# Patient Record
Sex: Female | Born: 1940 | Race: White | Hispanic: No | Marital: Single | Smoking: Never smoker
Health system: Southern US, Community
[De-identification: ages and names within clinical notes are randomized; demographics above are authoritative.]

## PROBLEM LIST (undated history)

## (undated) DIAGNOSIS — C801 Malignant (primary) neoplasm, unspecified: Secondary | ICD-10-CM

## (undated) HISTORY — PX: COLON SURGERY: SHX602

---

## 1999-07-31 ENCOUNTER — Encounter: Admission: RE | Admit: 1999-07-31 | Discharge: 1999-07-31 | Payer: Self-pay | Admitting: Gynecology

## 1999-07-31 ENCOUNTER — Encounter: Payer: Self-pay | Admitting: Gynecology

## 2000-09-13 ENCOUNTER — Encounter: Payer: Self-pay | Admitting: Gynecology

## 2000-09-13 ENCOUNTER — Encounter: Admission: RE | Admit: 2000-09-13 | Discharge: 2000-09-13 | Payer: Self-pay | Admitting: Gynecology

## 2000-09-13 ENCOUNTER — Other Ambulatory Visit: Admission: RE | Admit: 2000-09-13 | Discharge: 2000-09-13 | Payer: Self-pay | Admitting: Gynecology

## 2001-10-17 ENCOUNTER — Encounter: Admission: RE | Admit: 2001-10-17 | Discharge: 2001-10-17 | Payer: Self-pay | Admitting: Gynecology

## 2001-10-17 ENCOUNTER — Other Ambulatory Visit: Admission: RE | Admit: 2001-10-17 | Discharge: 2001-10-17 | Payer: Self-pay | Admitting: Gynecology

## 2001-10-17 ENCOUNTER — Encounter: Payer: Self-pay | Admitting: Gynecology

## 2002-10-23 ENCOUNTER — Encounter: Admission: RE | Admit: 2002-10-23 | Discharge: 2002-10-23 | Payer: Self-pay | Admitting: Gynecology

## 2002-10-23 ENCOUNTER — Other Ambulatory Visit: Admission: RE | Admit: 2002-10-23 | Discharge: 2002-10-23 | Payer: Self-pay | Admitting: Gynecology

## 2002-10-23 ENCOUNTER — Encounter: Payer: Self-pay | Admitting: Gynecology

## 2003-11-12 ENCOUNTER — Other Ambulatory Visit: Admission: RE | Admit: 2003-11-12 | Discharge: 2003-11-12 | Payer: Self-pay | Admitting: Gynecology

## 2004-04-07 ENCOUNTER — Encounter: Admission: RE | Admit: 2004-04-07 | Discharge: 2004-04-07 | Payer: Self-pay | Admitting: Gynecology

## 2005-04-13 ENCOUNTER — Encounter: Admission: RE | Admit: 2005-04-13 | Discharge: 2005-04-13 | Payer: Self-pay | Admitting: Gynecology

## 2005-04-13 ENCOUNTER — Other Ambulatory Visit: Admission: RE | Admit: 2005-04-13 | Discharge: 2005-04-13 | Payer: Self-pay | Admitting: Gynecology

## 2006-05-10 ENCOUNTER — Other Ambulatory Visit: Admission: RE | Admit: 2006-05-10 | Discharge: 2006-05-10 | Payer: Self-pay | Admitting: Gynecology

## 2006-05-10 ENCOUNTER — Encounter: Admission: RE | Admit: 2006-05-10 | Discharge: 2006-05-10 | Payer: Self-pay | Admitting: Gynecology

## 2007-07-04 ENCOUNTER — Encounter: Admission: RE | Admit: 2007-07-04 | Discharge: 2007-07-04 | Payer: Self-pay | Admitting: Gynecology

## 2007-07-11 ENCOUNTER — Encounter: Admission: RE | Admit: 2007-07-11 | Discharge: 2007-07-11 | Payer: Self-pay | Admitting: Gynecology

## 2007-12-19 ENCOUNTER — Encounter: Admission: RE | Admit: 2007-12-19 | Discharge: 2007-12-19 | Payer: Self-pay | Admitting: Gynecology

## 2008-07-05 ENCOUNTER — Encounter: Admission: RE | Admit: 2008-07-05 | Discharge: 2008-07-05 | Payer: Self-pay | Admitting: Gynecology

## 2009-07-06 ENCOUNTER — Encounter: Admission: RE | Admit: 2009-07-06 | Discharge: 2009-07-06 | Payer: Self-pay | Admitting: Gynecology

## 2010-02-04 ENCOUNTER — Encounter: Payer: Self-pay | Admitting: Gynecology

## 2010-06-19 IMAGING — MG MM DIAGNOSTIC UNILATERAL L
4 series · 4 of 4 positions shown · non-contrast
Comparison: 07/11/2007

CLINICAL DATA: Follow-up left breast calcifications.

DIGITAL DIAGNOSTIC LEFT MAMMOGRAM WITH CAD

[L CC (1 of 2)]
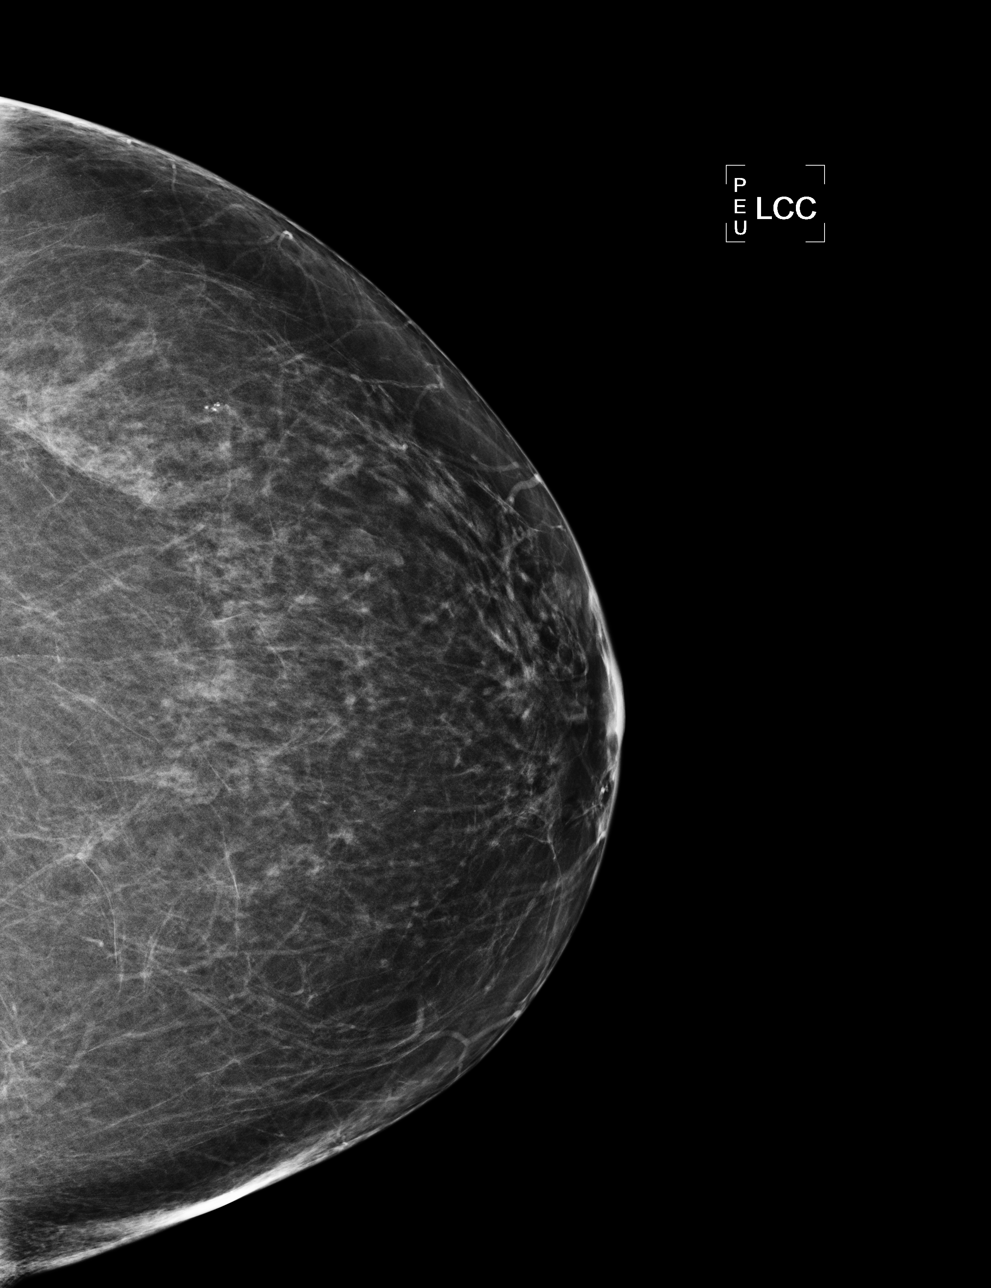

[L MLO]
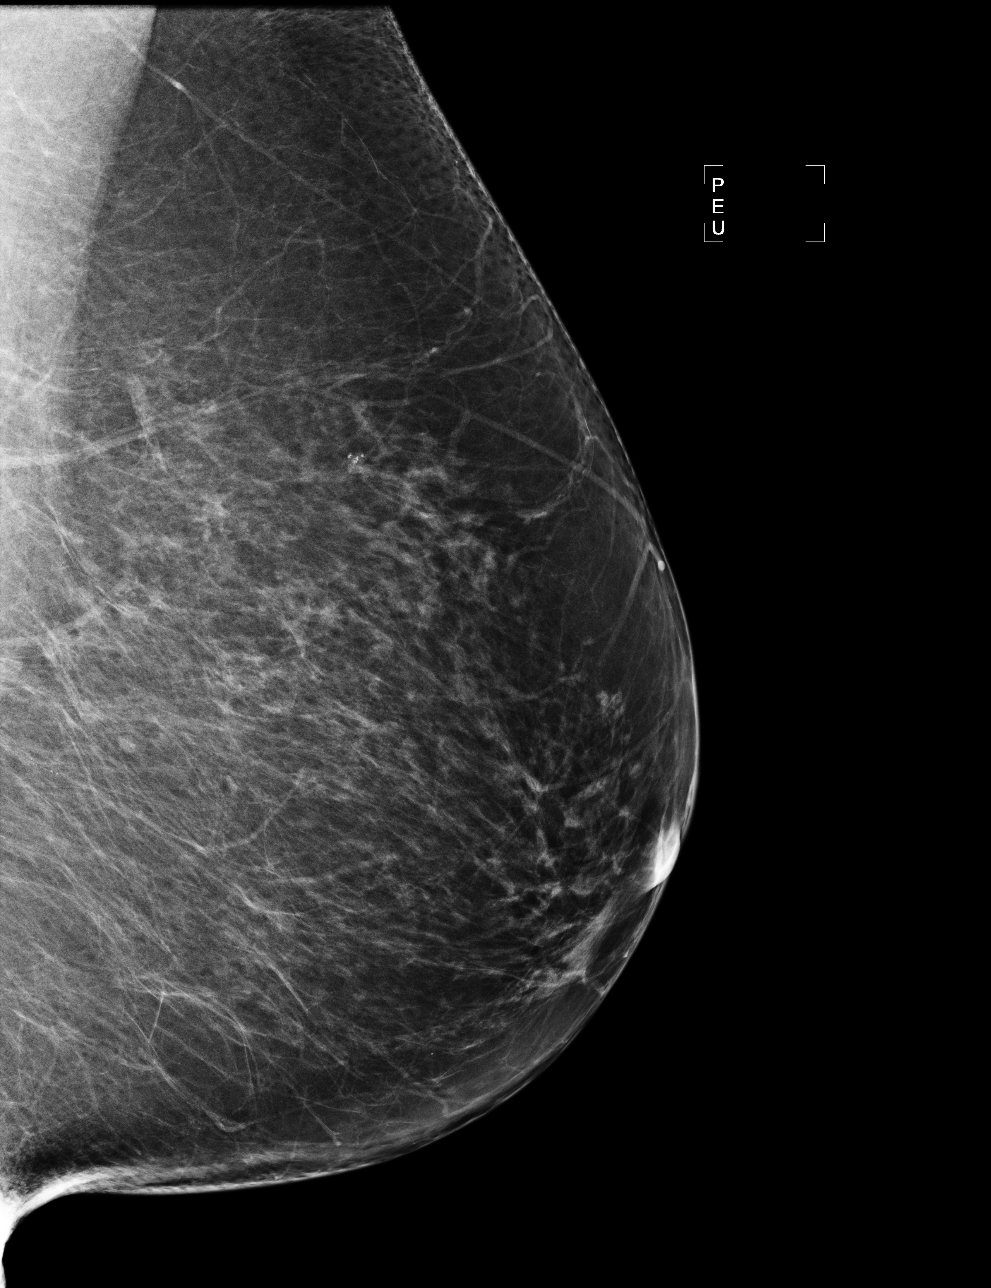

[L CC (2 of 2)]
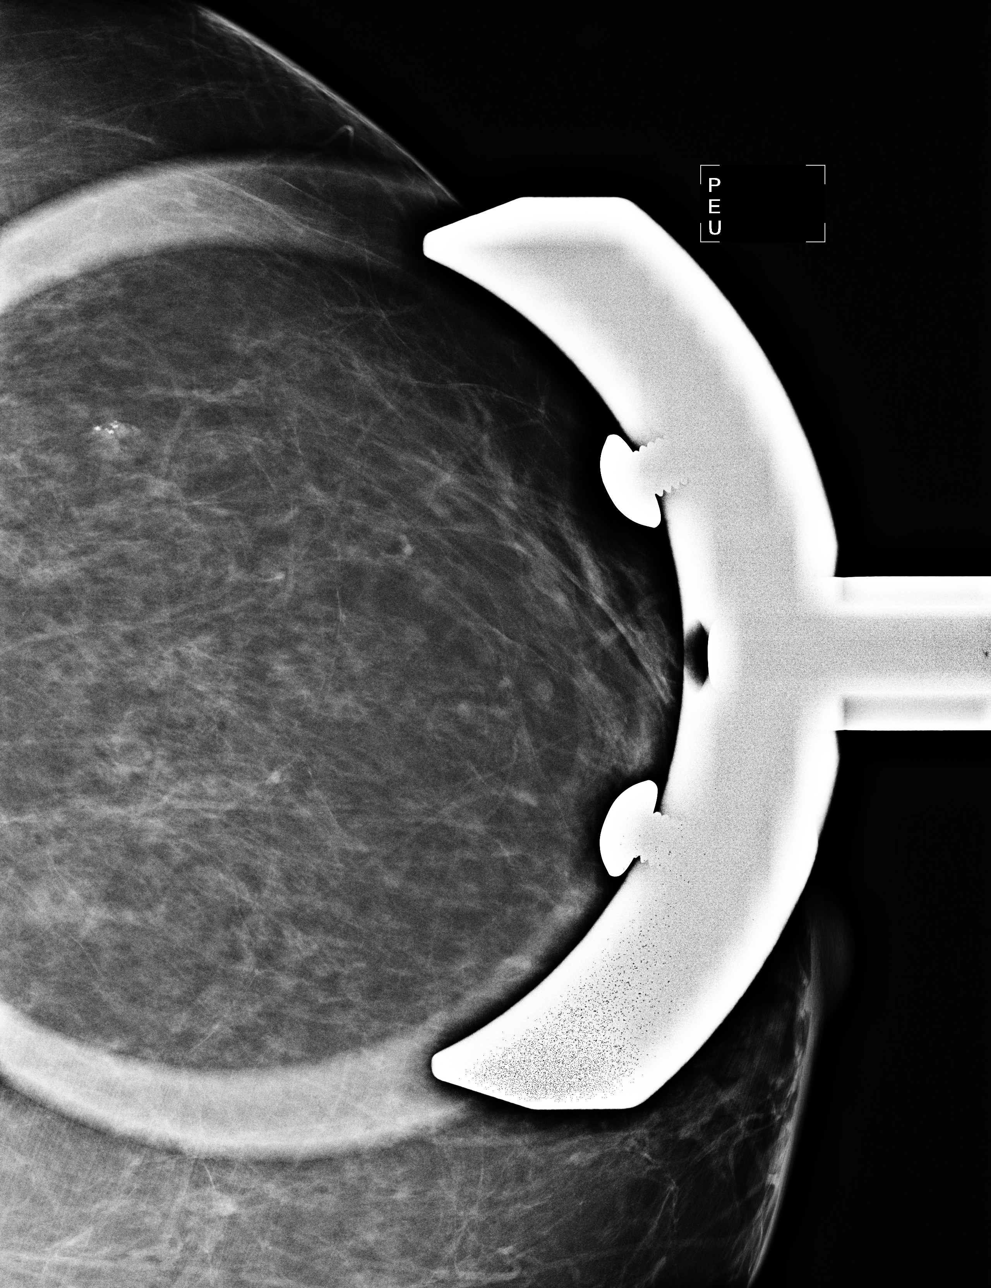

[L ML]
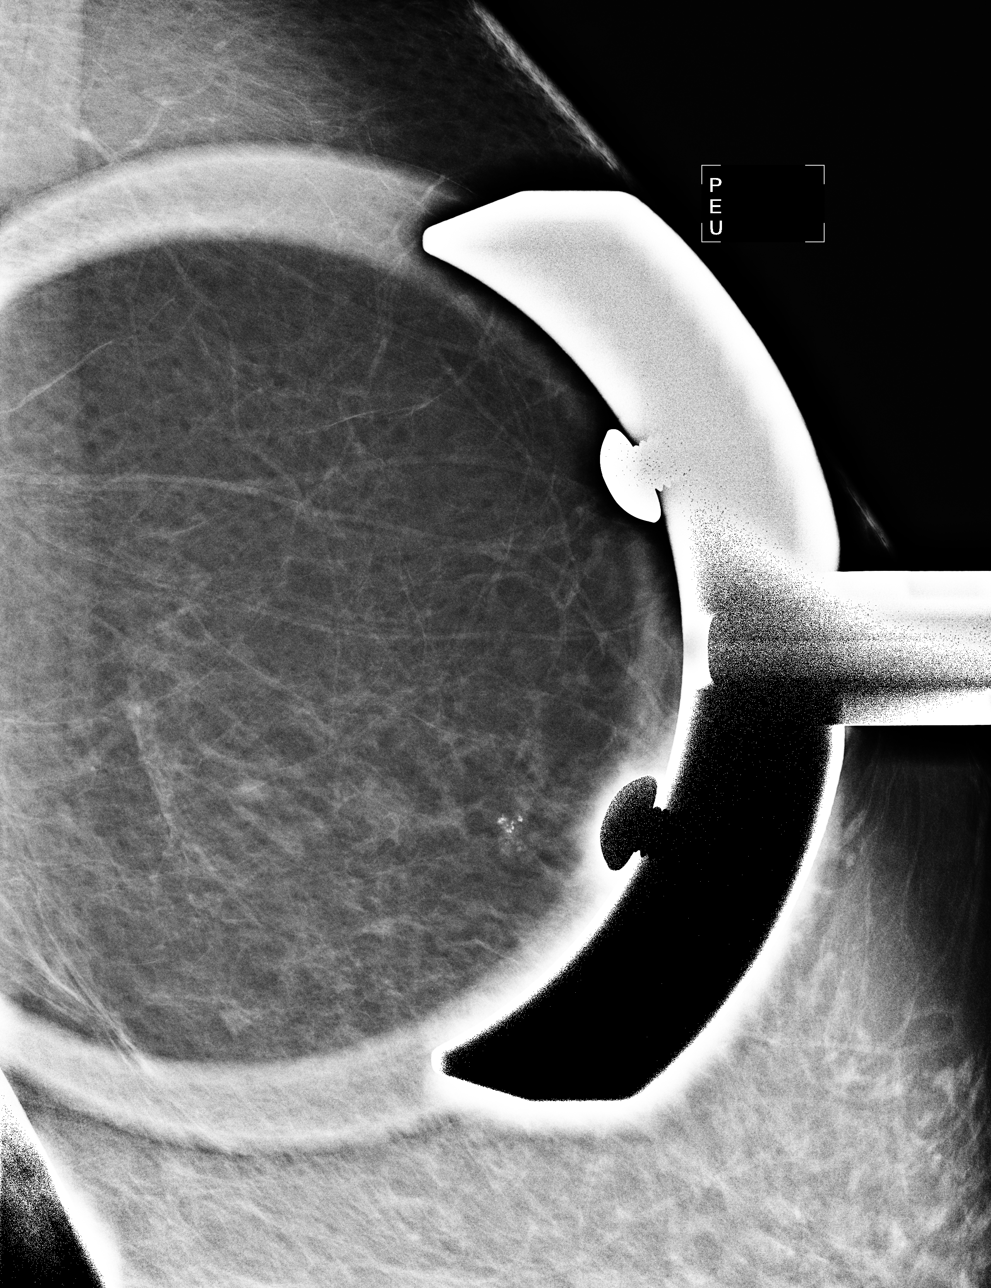

[4 of 4 positions shown; findings below may reference images not displayed]

FINDINGS: Magnification, CC, and MLO views of the left breast were
performed and again demonstrate primarily fatty tissue.
A cluster of calcifications within the upper outer left breast are
again noted and again have a benign type appearance.
There is no evidence of mass or distortion.
IMPRESSION: Probable benign calcifications within the left breast.  Recommend 6-
month followup mammogram to ensure stability of these likely benign
calcifications.

These findings were discussed with the patient.  She was encouraged
to continue monthly self exams and to contact the [REDACTED] or
primary physician if any changes noted.

BI-RADS CATEGORY 3:  Probably benign finding(s) - short interval
follow-up suggested.

Recommend bilateral diagnostic mammogram with magnification views
of the left breast to ensure stability of the left breast
calcifications and to resume annual bilateral mammogram schedule.

## 2010-07-31 ENCOUNTER — Other Ambulatory Visit: Payer: Self-pay | Admitting: Gynecology

## 2010-07-31 ENCOUNTER — Other Ambulatory Visit: Payer: Self-pay | Admitting: Internal Medicine

## 2010-07-31 DIAGNOSIS — Z1231 Encounter for screening mammogram for malignant neoplasm of breast: Secondary | ICD-10-CM

## 2010-08-08 ENCOUNTER — Ambulatory Visit
Admission: RE | Admit: 2010-08-08 | Discharge: 2010-08-08 | Disposition: A | Discharge status: Home / Self Care | Payer: Medicare Other | Source: Ambulatory Visit | Attending: Gynecology | Admitting: Gynecology

## 2010-08-08 DIAGNOSIS — Z1231 Encounter for screening mammogram for malignant neoplasm of breast: Secondary | ICD-10-CM

## 2011-07-10 ENCOUNTER — Other Ambulatory Visit: Payer: Self-pay | Admitting: *Deleted

## 2011-07-10 ENCOUNTER — Other Ambulatory Visit: Payer: Self-pay | Admitting: Gynecology

## 2011-07-10 DIAGNOSIS — Z1231 Encounter for screening mammogram for malignant neoplasm of breast: Secondary | ICD-10-CM

## 2011-08-09 ENCOUNTER — Ambulatory Visit
Admission: RE | Admit: 2011-08-09 | Discharge: 2011-08-09 | Disposition: A | Discharge status: Home / Self Care | Payer: Medicare Other | Source: Ambulatory Visit | Attending: Family Medicine | Admitting: Family Medicine

## 2011-08-09 DIAGNOSIS — Z1231 Encounter for screening mammogram for malignant neoplasm of breast: Secondary | ICD-10-CM

## 2012-06-23 ENCOUNTER — Other Ambulatory Visit: Payer: Self-pay

## 2012-06-23 DIAGNOSIS — Z1231 Encounter for screening mammogram for malignant neoplasm of breast: Secondary | ICD-10-CM

## 2012-08-11 ENCOUNTER — Ambulatory Visit
Admission: RE | Admit: 2012-08-11 | Discharge: 2012-08-11 | Disposition: A | Discharge status: Home / Self Care | Payer: Medicare Other | Source: Ambulatory Visit

## 2012-08-11 DIAGNOSIS — Z1231 Encounter for screening mammogram for malignant neoplasm of breast: Secondary | ICD-10-CM

## 2013-06-15 ENCOUNTER — Other Ambulatory Visit: Payer: Self-pay

## 2013-06-15 DIAGNOSIS — Z1231 Encounter for screening mammogram for malignant neoplasm of breast: Secondary | ICD-10-CM

## 2013-08-12 ENCOUNTER — Encounter (INDEPENDENT_AMBULATORY_CARE_PROVIDER_SITE_OTHER): Payer: Self-pay

## 2013-08-12 ENCOUNTER — Ambulatory Visit
Admission: RE | Admit: 2013-08-12 | Discharge: 2013-08-12 | Disposition: A | Discharge status: Home / Self Care | Payer: Medicare Other | Source: Ambulatory Visit

## 2013-08-12 DIAGNOSIS — Z1231 Encounter for screening mammogram for malignant neoplasm of breast: Secondary | ICD-10-CM

## 2014-07-21 ENCOUNTER — Other Ambulatory Visit: Payer: Self-pay

## 2014-07-21 DIAGNOSIS — Z1231 Encounter for screening mammogram for malignant neoplasm of breast: Secondary | ICD-10-CM

## 2014-08-23 ENCOUNTER — Ambulatory Visit
Admission: RE | Admit: 2014-08-23 | Discharge: 2014-08-23 | Disposition: A | Discharge status: Home / Self Care | Payer: Medicare Other | Source: Ambulatory Visit

## 2014-08-23 DIAGNOSIS — Z1231 Encounter for screening mammogram for malignant neoplasm of breast: Secondary | ICD-10-CM

## 2015-07-28 ENCOUNTER — Other Ambulatory Visit: Payer: Self-pay | Admitting: Family Medicine

## 2015-07-28 DIAGNOSIS — Z1231 Encounter for screening mammogram for malignant neoplasm of breast: Secondary | ICD-10-CM

## 2015-08-24 ENCOUNTER — Ambulatory Visit
Admission: RE | Admit: 2015-08-24 | Discharge: 2015-08-24 | Disposition: A | Discharge status: Home / Self Care | Payer: Medicare Other | Source: Ambulatory Visit | Attending: Family Medicine | Admitting: Family Medicine

## 2015-08-24 DIAGNOSIS — Z1231 Encounter for screening mammogram for malignant neoplasm of breast: Secondary | ICD-10-CM

## 2016-07-23 ENCOUNTER — Other Ambulatory Visit: Payer: Self-pay | Admitting: Family Medicine

## 2016-07-23 DIAGNOSIS — Z1231 Encounter for screening mammogram for malignant neoplasm of breast: Secondary | ICD-10-CM

## 2016-08-27 ENCOUNTER — Ambulatory Visit: Payer: Medicare Other

## 2016-08-29 ENCOUNTER — Ambulatory Visit
Admission: RE | Admit: 2016-08-29 | Discharge: 2016-08-29 | Disposition: A | Discharge status: Home / Self Care | Payer: Medicare Other | Source: Ambulatory Visit | Attending: Family Medicine | Admitting: Family Medicine

## 2016-08-29 DIAGNOSIS — Z1231 Encounter for screening mammogram for malignant neoplasm of breast: Secondary | ICD-10-CM

## 2017-08-01 ENCOUNTER — Other Ambulatory Visit: Payer: Self-pay | Admitting: Family Medicine

## 2017-08-01 DIAGNOSIS — Z1231 Encounter for screening mammogram for malignant neoplasm of breast: Secondary | ICD-10-CM

## 2017-08-30 ENCOUNTER — Ambulatory Visit: Payer: Medicare Other

## 2017-09-05 ENCOUNTER — Ambulatory Visit
Admission: RE | Admit: 2017-09-05 | Discharge: 2017-09-05 | Disposition: A | Discharge status: Home / Self Care | Payer: Medicare Other | Source: Ambulatory Visit | Attending: Family Medicine | Admitting: Family Medicine

## 2017-09-05 DIAGNOSIS — Z1231 Encounter for screening mammogram for malignant neoplasm of breast: Secondary | ICD-10-CM

## 2018-08-01 ENCOUNTER — Other Ambulatory Visit: Payer: Self-pay | Admitting: Family Medicine

## 2018-08-01 DIAGNOSIS — Z1231 Encounter for screening mammogram for malignant neoplasm of breast: Secondary | ICD-10-CM

## 2018-09-16 ENCOUNTER — Ambulatory Visit
Admission: RE | Admit: 2018-09-16 | Discharge: 2018-09-16 | Disposition: A | Discharge status: Home / Self Care | Payer: Medicare Other | Source: Ambulatory Visit | Attending: Family Medicine | Admitting: Family Medicine

## 2018-09-16 ENCOUNTER — Other Ambulatory Visit: Payer: Self-pay

## 2018-09-16 DIAGNOSIS — Z1231 Encounter for screening mammogram for malignant neoplasm of breast: Secondary | ICD-10-CM

## 2019-09-15 ENCOUNTER — Other Ambulatory Visit: Payer: Self-pay | Admitting: Family Medicine

## 2019-09-15 DIAGNOSIS — Z1231 Encounter for screening mammogram for malignant neoplasm of breast: Secondary | ICD-10-CM

## 2019-09-17 ENCOUNTER — Other Ambulatory Visit: Payer: Self-pay

## 2019-09-17 ENCOUNTER — Ambulatory Visit
Admission: RE | Admit: 2019-09-17 | Discharge: 2019-09-17 | Disposition: A | Discharge status: Home / Self Care | Payer: Medicare Other | Source: Ambulatory Visit | Attending: Family Medicine | Admitting: Family Medicine

## 2019-09-17 DIAGNOSIS — Z1231 Encounter for screening mammogram for malignant neoplasm of breast: Secondary | ICD-10-CM

## 2020-08-15 ENCOUNTER — Other Ambulatory Visit: Payer: Self-pay | Admitting: Family Medicine

## 2020-08-15 DIAGNOSIS — Z1231 Encounter for screening mammogram for malignant neoplasm of breast: Secondary | ICD-10-CM

## 2020-10-04 ENCOUNTER — Other Ambulatory Visit: Payer: Self-pay

## 2020-10-04 ENCOUNTER — Ambulatory Visit
Admission: RE | Admit: 2020-10-04 | Discharge: 2020-10-04 | Disposition: A | Discharge status: Home / Self Care | Payer: Medicare Other | Source: Ambulatory Visit | Attending: Family Medicine | Admitting: Family Medicine

## 2020-10-04 DIAGNOSIS — Z1231 Encounter for screening mammogram for malignant neoplasm of breast: Secondary | ICD-10-CM

## 2021-09-04 ENCOUNTER — Other Ambulatory Visit: Payer: Self-pay | Admitting: Family Medicine

## 2021-09-04 DIAGNOSIS — Z1231 Encounter for screening mammogram for malignant neoplasm of breast: Secondary | ICD-10-CM

## 2021-10-05 ENCOUNTER — Ambulatory Visit
Admission: RE | Admit: 2021-10-05 | Discharge: 2021-10-05 | Disposition: A | Discharge status: Home / Self Care | Payer: Medicare Other | Source: Ambulatory Visit | Attending: Family Medicine | Admitting: Family Medicine

## 2021-10-05 DIAGNOSIS — Z1231 Encounter for screening mammogram for malignant neoplasm of breast: Secondary | ICD-10-CM

## 2021-11-04 ENCOUNTER — Encounter: Payer: Self-pay | Admitting: Emergency Medicine

## 2021-11-04 ENCOUNTER — Ambulatory Visit
Admission: EM | Admit: 2021-11-04 | Discharge: 2021-11-04 | Disposition: A | Discharge status: Home / Self Care | Payer: Medicare Other

## 2021-11-04 DIAGNOSIS — J019 Acute sinusitis, unspecified: Secondary | ICD-10-CM | POA: Diagnosis not present

## 2021-11-04 DIAGNOSIS — B9689 Other specified bacterial agents as the cause of diseases classified elsewhere: Secondary | ICD-10-CM | POA: Diagnosis not present

## 2021-11-04 HISTORY — DX: Malignant (primary) neoplasm, unspecified: C80.1

## 2021-11-04 MED ORDER — AMOXICILLIN-POT CLAVULANATE 875-125 MG PO TABS: 1.0000 | ORAL_TABLET | Freq: Two times a day (BID) | ORAL | 0 refills | Status: AC

## 2021-11-04 NOTE — ED Provider Notes (Signed)
[facility]    CSN: [id number] Arrival date & time: 11/04/21  1307      History   Chief Complaint Chief Complaint  Patient presents with   Diarrhea    HPI Jessica Farley is a 81 y.o. female.   HPI  Patient presents to UC with complaint of diarrhea starting oral weeks ago.  She says she has multiple episodes of watery diarrhea.  Was seen by GI on 10 2 and treated for colitis with budesonide.  Patient states she has had this treatment in the past and it is previously effective but this time caused worsening of the diarrhea.  She has stopped the medication on the advice of the GI provider office.  She has EGD scheduled for November.  Patient also complains of 2 weeks of sinus infection.  Endorses right sided sinus pain/pressure with significant drainage.  She believes the symptoms are connected to her increased diarrhea.  Past Medical History:  Diagnosis Date   Cancer ([place])     There are no problems to display for this patient.   Past Surgical History:  Procedure Laterality Date   COLON SURGERY      OB History   No obstetric history on file.      Home Medications    Prior to Admission medications   Medication Sig Start Date End Date Taking? Authorizing Provider  budesonide (ENTOCORT EC) 3 MG 24 hr capsule Take by mouth. 10/17/21 11/16/21 Yes [provider]  escitalopram (LEXAPRO) 10 MG tablet Take 10 mg by mouth daily. 08/17/21   [provider]  lansoprazole (PREVACID) 30 MG capsule Take 30 mg by mouth every morning. 10/17/21   [provider]  Levothyroxine Sodium 50 MCG CAPS Take by mouth.    [provider]    Family History Family History  Problem Relation Age of Onset   Breast cancer Sister     Social History Social History   Tobacco Use   Smoking status: Never   Smokeless tobacco: Never  Vaping Use   Vaping Use: Never used  Substance Use Topics   Alcohol use: Never   Drug use: Never      Allergies   Gluten meal and Sertraline   Review of Systems Review of Systems   Physical Exam Triage Vital Signs ED Triage Vitals  Enc Vitals Group     BP 11/04/21 1346 96/65     Pulse Rate 11/04/21 1346 77     Resp 11/04/21 1346 16     Temp 11/04/21 1346 (!) 97.5 F (36.4 C)     Temp src --      SpO2 11/04/21 1346 98 %     Weight --      Height --      Head Circumference --      Peak Flow --      Pain Score 11/04/21 1347 1     Pain Loc --      Pain Edu? --      Excl. in [place]? --    No data found.  Updated Vital Signs BP 96/65   Pulse 77   Temp (!) 97.5 F (36.4 C)   Resp 16   SpO2 98%   Visual Acuity Right Eye Distance:   Left Eye Distance:   Bilateral Distance:    Right Eye Near:   Left Eye Near:    Bilateral Near:     Physical Exam   UC Treatments / Results  Labs (all  labs ordered are listed, but only abnormal results are displayed) Labs Reviewed - No data to display  EKG   Radiology No results found.  Procedures Procedures (including critical care time)  Medications Ordered in UC Medications - No data to display  Initial Impression / Assessment and Plan / UC Course  I have reviewed the triage vital signs and the nursing notes.  Pertinent labs & imaging results that were available during my care of the patient were reviewed by me and considered in my medical decision making (see chart for details).   Patient is hypotensive today however this is possibly baseline.  She endorses dizziness.  Patient is positive for right-sided frontal and maxillary tenderness.  Given symptoms x2 weeks, will treat for acute bacterial sinusitis.  Discussed possible side effects of Augmentin including GI symptoms.  Suggested she try Imodium for relief of frequent liquid stools.   Final Clinical Impressions(s) / UC Diagnoses   Final diagnoses:  None   Discharge Instructions   None    ED Prescriptions   None    PDMP not reviewed this encounter.    Rose Phi, [place] 11/04/21 1410

## 2021-11-04 NOTE — Discharge Instructions (Addendum)
Follow-up with your primary care provider at your appointment next week.  Follow-up with your GI provider regarding your symptoms of diarrhea.

## 2021-11-04 NOTE — ED Triage Notes (Signed)
Pt is present today with c/o diarrhea. Pt sx started 15 days ago

## 2022-06-27 ENCOUNTER — Other Ambulatory Visit (HOSPITAL_COMMUNITY): Payer: Self-pay

## 2022-09-04 ENCOUNTER — Other Ambulatory Visit: Payer: Self-pay | Admitting: Family Medicine

## 2022-09-04 DIAGNOSIS — Z1231 Encounter for screening mammogram for malignant neoplasm of breast: Secondary | ICD-10-CM

## 2022-10-08 ENCOUNTER — Ambulatory Visit
Admission: RE | Admit: 2022-10-08 | Discharge: 2022-10-08 | Disposition: A | Discharge status: Home / Self Care | Payer: HMO | Source: Ambulatory Visit | Attending: Family Medicine | Admitting: Family Medicine

## 2022-10-08 DIAGNOSIS — Z1231 Encounter for screening mammogram for malignant neoplasm of breast: Secondary | ICD-10-CM

## 2023-09-25 ENCOUNTER — Other Ambulatory Visit: Payer: Self-pay | Admitting: Family Medicine

## 2023-09-25 DIAGNOSIS — Z1231 Encounter for screening mammogram for malignant neoplasm of breast: Secondary | ICD-10-CM

## 2023-10-09 ENCOUNTER — Ambulatory Visit
Admission: RE | Admit: 2023-10-09 | Discharge: 2023-10-09 | Disposition: A | Discharge status: Home / Self Care | Source: Ambulatory Visit | Attending: Family Medicine | Admitting: Family Medicine

## 2023-10-09 DIAGNOSIS — Z1231 Encounter for screening mammogram for malignant neoplasm of breast: Secondary | ICD-10-CM
